# Patient Record
Sex: Female | Born: 1985 | Race: White | Hispanic: No | Marital: Single | Smoking: Never smoker
Health system: Southern US, Community
[De-identification: ages and names within clinical notes are randomized; demographics above are authoritative.]

---

## 2017-02-02 ENCOUNTER — Encounter (HOSPITAL_COMMUNITY): Payer: Self-pay

## 2017-02-02 ENCOUNTER — Emergency Department (HOSPITAL_COMMUNITY)
Admission: EM | Admit: 2017-02-02 | Discharge: 2017-02-02 | Disposition: A | Discharge status: Home / Self Care | Payer: Self-pay | Attending: Emergency Medicine | Admitting: Emergency Medicine

## 2017-02-02 ENCOUNTER — Emergency Department (HOSPITAL_COMMUNITY): Payer: Self-pay

## 2017-02-02 DIAGNOSIS — R51 Headache: Secondary | ICD-10-CM | POA: Insufficient documentation

## 2017-02-02 DIAGNOSIS — Y999 Unspecified external cause status: Secondary | ICD-10-CM | POA: Insufficient documentation

## 2017-02-02 DIAGNOSIS — M791 Myalgia, unspecified site: Secondary | ICD-10-CM

## 2017-02-02 DIAGNOSIS — W57XXXA Bitten or stung by nonvenomous insect and other nonvenomous arthropods, initial encounter: Secondary | ICD-10-CM

## 2017-02-02 DIAGNOSIS — Z79899 Other long term (current) drug therapy: Secondary | ICD-10-CM | POA: Insufficient documentation

## 2017-02-02 DIAGNOSIS — Y929 Unspecified place or not applicable: Secondary | ICD-10-CM | POA: Insufficient documentation

## 2017-02-02 DIAGNOSIS — R5383 Other fatigue: Secondary | ICD-10-CM

## 2017-02-02 DIAGNOSIS — S30861A Insect bite (nonvenomous) of abdominal wall, initial encounter: Secondary | ICD-10-CM | POA: Insufficient documentation

## 2017-02-02 DIAGNOSIS — Y939 Activity, unspecified: Secondary | ICD-10-CM | POA: Insufficient documentation

## 2017-02-02 DIAGNOSIS — R519 Headache, unspecified: Secondary | ICD-10-CM

## 2017-02-02 LAB — CBC WITH DIFFERENTIAL/PLATELET
BASOS ABS: 0.1 10*3/uL (ref 0.0–0.1)
Basophils Relative: 0 %
Eosinophils Absolute: 0.4 10*3/uL (ref 0.0–0.7)
Eosinophils Relative: 3 %
HEMATOCRIT: 40.1 % (ref 36.0–46.0)
Hemoglobin: 13.8 g/dL (ref 12.0–15.0)
LYMPHS ABS: 4.4 10*3/uL — AB (ref 0.7–4.0)
LYMPHS PCT: 32 %
MCH: 32.8 pg (ref 26.0–34.0)
MCHC: 34.4 g/dL (ref 30.0–36.0)
MCV: 95.2 fL (ref 78.0–100.0)
Monocytes Absolute: 1.1 10*3/uL — ABNORMAL HIGH (ref 0.1–1.0)
Monocytes Relative: 8 %
NEUTROS ABS: 7.7 10*3/uL (ref 1.7–7.7)
Neutrophils Relative %: 57 %
Platelets: 377 10*3/uL (ref 150–400)
RBC: 4.21 MIL/uL (ref 3.87–5.11)
RDW: 12.8 % (ref 11.5–15.5)
WBC: 13.6 10*3/uL — ABNORMAL HIGH (ref 4.0–10.5)

## 2017-02-02 LAB — COMPREHENSIVE METABOLIC PANEL
ALK PHOS: 82 U/L (ref 38–126)
ALT: 13 U/L — AB (ref 14–54)
AST: 17 U/L (ref 15–41)
Albumin: 4.6 g/dL (ref 3.5–5.0)
Anion gap: 7 (ref 5–15)
BILIRUBIN TOTAL: 0.5 mg/dL (ref 0.3–1.2)
BUN: 14 mg/dL (ref 6–20)
CALCIUM: 9.4 mg/dL (ref 8.9–10.3)
CO2: 29 mmol/L (ref 22–32)
CREATININE: 0.69 mg/dL (ref 0.44–1.00)
Chloride: 106 mmol/L (ref 101–111)
Glucose, Bld: 101 mg/dL — ABNORMAL HIGH (ref 65–99)
Potassium: 3.6 mmol/L (ref 3.5–5.1)
Sodium: 142 mmol/L (ref 135–145)
TOTAL PROTEIN: 7.8 g/dL (ref 6.5–8.1)

## 2017-02-02 LAB — I-STAT BETA HCG BLOOD, ED (MC, WL, AP ONLY)

## 2017-02-02 LAB — I-STAT CG4 LACTIC ACID, ED: Lactic Acid, Venous: 1.15 mmol/L (ref 0.5–1.9)

## 2017-02-02 MED ORDER — DOXYCYCLINE HYCLATE 100 MG PO CAPS: 100.0000 mg | ORAL_CAPSULE | Freq: Two times a day (BID) | ORAL | 0 refills | Status: AC

## 2017-02-02 MED ORDER — ACETAMINOPHEN 500 MG PO TABS
1000.0000 mg | ORAL_TABLET | Freq: Once | ORAL | Status: AC
  Administered 2017-02-02: 1000 mg via ORAL
  Filled 2017-02-02: qty 2

## 2017-02-02 NOTE — ED Triage Notes (Signed)
She c/o generalized h/a since last Wed. She states she removed a tick from right thoracic area last Thurs. She is in no distress.

## 2017-02-02 NOTE — Discharge Instructions (Signed)
Please take Ibuprofen (Advil, motrin) and Tylenol (acetaminophen) to relieve your pain.  You may take up to 800 MG (4 pills) of normal strength ibuprofen every 8 hours as needed.  In between doses of ibuprofen you make take tylenol, up to 1,000 mg (two extra strength pills).  Do not take more than 3,000 mg tylenol in a 24 hour period.  Please check all medication labels as many medications such as pain and cold medications may contain tylenol.  Do not drink alcohol while taking these medications.  Do not take other NSAID'S while taking ibuprofen (such as aleve or naproxen).  Please take ibuprofen with food to decrease stomach upset. ° ° °

## 2017-02-02 NOTE — ED Provider Notes (Signed)
[facility]-EMERGENCY DEPT Provider Note   CSN: [id number] Arrival date & time: 02/02/17  1152  By signing my name below, I, Linna Darner, attest that this documentation has been prepared under the direction and in the presence of Lyndel Safe, [place]. Electronically Signed: Linna Darner, Scribe. 02/02/2017. 12:47 PM.  History   Chief Complaint Chief Complaint  Patient presents with  . Headache   The history is provided by the patient. No language interpreter was used.   HPI Comments: Christina Huynh is a 31 y.o. female who presents to the Emergency Department complaining of a constant, waxing and waning, frontal headache beginning one week ago. She reports some associated intermittent chills and "hot flashes", nausea without vomiting, mild dyspnea, and generalized body aches. Patient reports that she noticed a tick on the right side of her abdomen six days ago and subsequently removed it. Patient tried a Advertising account executive prior to arrival today without improvement of her symptoms. She also uses Vicodin for chronic back and right knee pain and took one dose earlier today. She doubts the possibility of pregnancy as she recently had a normal scheduled menses. Patient denies known fevers, rhinorrhea, congestion, postnasal drip, cough, sneezing, ear pain, rashes, neck pain, dysuria, or any other associated symptoms.  No past medical history on file.  There are no active problems to display for this patient.   No past surgical history on file.  OB History    No data available       Home Medications    Prior to Admission medications   Medication Sig Start Date End Date Taking? Authorizing Provider  doxycycline (VIBRAMYCIN) 100 MG capsule Take 1 capsule (100 mg total) by mouth 2 (two) times daily. 02/02/17   Cristina Gong, PA-C    Family History No family history on file.  Social History Social History  Substance Use Topics  . Smoking status: Never Smoker  . Smokeless tobacco:  Never Used  . Alcohol use No     Allergies   Patient has no known allergies.   Review of Systems Review of Systems  Constitutional: Positive for chills and fatigue. Negative for diaphoresis and fever.  HENT: Negative for congestion, ear pain, rhinorrhea and sneezing.   Eyes: Positive for photophobia. Negative for visual disturbance.  Respiratory: Positive for shortness of breath (Mild). Negative for cough.   Cardiovascular: Negative for chest pain and palpitations.  Gastrointestinal: Positive for nausea. Negative for abdominal pain, constipation, diarrhea and vomiting.  Genitourinary: Negative for decreased urine volume, dysuria and flank pain.  Musculoskeletal: Positive for arthralgias (right knee, baseline), back pain (baseline) and myalgias. Negative for neck pain and neck stiffness.  Skin: Positive for wound (From tick bite, on right anterior chest). Negative for color change and rash.  Neurological: Positive for headaches. Negative for speech difficulty, weakness, light-headedness and numbness.   Physical Exam Updated Vital Signs BP 136/81 (BP Location: Right Arm)   Pulse 85   Temp 98.6 F (37 C) (Oral)   Resp 18   LMP 01/17/2017 (Approximate)   SpO2 96%   Physical Exam  Constitutional: She is oriented to person, place, and time. She appears well-developed and well-nourished. No distress.  HENT:  Head: Normocephalic and atraumatic.  Right Ear: External ear normal.  Left Ear: External ear normal.  Eyes: Conjunctivae are normal. No scleral icterus.  Neck: Normal range of motion. Neck supple. No tracheal deviation present.  Cardiovascular: Normal rate, regular rhythm, normal heart sounds and intact distal pulses.  Exam reveals no friction  rub.   No murmur heard. Pulmonary/Chest: Effort normal and breath sounds normal. No respiratory distress. She has no wheezes.  Abdominal: Soft. She exhibits no distension. There is no tenderness.  Musculoskeletal: Normal range of  motion. She exhibits no edema or deformity.  Lymphadenopathy:    She has no cervical adenopathy.  Neurological: She is alert and oriented to person, place, and time. No sensory deficit. She exhibits normal muscle tone.  Mental Status:  Alert, oriented, thought content appropriate, able to give a coherent history. Speech fluent without evidence of aphasia. Able to follow 2 step commands without difficulty.  Cranial Nerves:  II:  Peripheral visual fields grossly normal, pupils equal, round, reactive to light III,IV, VI: ptosis not present, extra-ocular motions intact bilaterally  V,VII: smile symmetric, facial light touch sensation equal VIII: hearing grossly normal to voice  X: uvula elevates symmetrically  XI: bilateral shoulder shrug symmetric and strong XII: midline tongue extension without fassiculations Motor:  Normal tone. 5/5 in upper and lower extremities bilaterally including strong and equal grip strength and dorsiflexion/plantar flexion Gait: normal gait and balance CV: distal pulses palpable throughout   Skin: Skin is warm and dry.  Small red papule to right anterior chest under her breast from reported tick.  No fluctuance, abscess.  Small sub cm area of induration.  No rash around papule.   No rashes to arms, legs, or torso  Psychiatric: She has a normal mood and affect. Her behavior is normal.  Nursing note and vitals reviewed.  ED Treatments / Results  Labs (all labs ordered are listed, but only abnormal results are displayed) Labs Reviewed  CBC WITH DIFFERENTIAL/PLATELET - Abnormal; Notable for the following:       Result Value   WBC 13.6 (*)    Lymphs Abs 4.4 (*)    Monocytes Absolute 1.1 (*)    All other components within normal limits  COMPREHENSIVE METABOLIC PANEL - Abnormal; Notable for the following:    Glucose, Bld 101 (*)    ALT 13 (*)    All other components within normal limits  ROCKY MTN SPOTTED FVR ABS PNL(IGG+IGM)  B. BURGDORFI ANTIBODIES    EHRLICHIA ANTIBODY PANEL  I-STAT BETA HCG BLOOD, ED ([facility], [facility], [facility] ONLY)  I-STAT CG4 LACTIC ACID, ED    EKG  EKG Interpretation None       Radiology Dg Chest 2 View  Result Date: 02/02/2017 CLINICAL DATA:  Removed a tick from the right static of the thorax last Thursday. The patient reports generalized body aches and dyspnea. Headache for the past week. EXAM: CHEST  2 VIEW COMPARISON:  None in PACs FINDINGS: The lungs are adequately inflated and clear. The heart and pulmonary vascularity are normal. The mediastinum is normal in width. There is no pleural effusion. The trachea is midline. The bony thorax exhibits no acute abnormality. IMPRESSION: There is no active cardiopulmonary disease. Electronically Signed   By: David  [place] M.D.   On: 02/02/2017 14:14    Procedures Procedures (including critical care time)  DIAGNOSTIC STUDIES: Oxygen Saturation is 96% on RA, adequate by my interpretation.    COORDINATION OF CARE: 12:46 PM Discussed treatment plan with pt at bedside and pt agreed to plan.  Medications Ordered in ED Medications  acetaminophen (TYLENOL) tablet 1,000 mg (1,000 mg Oral Given 02/02/17 1321)     Initial Impression / Assessment and Plan / ED Course  I have reviewed the triage vital signs and the nursing notes.  Pertinent labs & imaging results  that were available during my care of the patient were reviewed by me and considered in my medical decision making (see chart for details).  Clinical Course as of Feb 02 1937  Wed Feb 02, 2017  1456 Re-checked patient.  She reports her headache isn't much better.  She was given the option to stay and receive additional medications or get her rx and go home.  She chose to go home.   [EH]    Clinical Course User Index [EH] Cristina GongHammond, Jayleena Stille W, PA-C   Christina MossesAlisha Henckel presents with one week of headache, fatigue, mild dyspneaand muscle aches after removing a tick.  She has not had any rashes.  Based on symptoms and tick bite  history ordered basic labs and tick panel.  Patient is with out obvious rashes, normal sodium level.  CXR obtained based on dyspnea, no obvious abnormalities.  Labs reviewed.  Patient continued to have headache, slightly better but still present.  Patient given the option to stay, declined.   Patient instructed on proper hydration, given strict return precautions and rx for doxycycline.  Patient was given the option to ask questions, all of which were answered to the best of my ability.  Patient instructed to follow up with her PCP regarding her symptoms. Patient instructed the doxycycline may make her sensitive to the sun.  Patient is aware that she has blood work for tick borne illness is pending.   At this time there does not appear to be any evidence of an acute emergency medical condition and the patient appears stable for discharge with appropriate outpatient follow up.Diagnosis was discussed with patient who verbalizes understanding and is agreeable to discharge. Pt case discussed with Dr. Adela LankFloyd who agrees with my plan.     Final Clinical Impressions(s) / ED Diagnoses   Final diagnoses:  Acute nonintractable headache, unspecified headache type  Tick bite, initial encounter  Fatigue, unspecified type  Myalgia    New Prescriptions Discharge Medication List as of 02/02/2017  3:02 PM    START taking these medications   Details  doxycycline (VIBRAMYCIN) 100 MG capsule Take 1 capsule (100 mg total) by mouth 2 (two) times daily., Starting Wed 02/02/2017, Print       I personally performed the services described in this documentation, which was scribed in my presence. The recorded information has been reviewed and is accurate.    Cristina GongHammond, Ladasha Schnackenberg W, PA-C 02/02/17 1952    Melene PlanFloyd, Dan, DO 02/03/17 [phone]

## 2017-02-03 LAB — ROCKY MTN SPOTTED FVR ABS PNL(IGG+IGM)
RMSF IGM: 0.78 {index} (ref 0.00–0.89)
RMSF IgG: NEGATIVE

## 2017-02-03 LAB — B. BURGDORFI ANTIBODIES: B burgdorferi Ab IgG+IgM: <0.91 {ISR} (ref 0.00–0.90)

## 2017-02-08 LAB — EHRLICHIA ANTIBODY PANEL
E CHAFFEENSIS AB, IGM: NEGATIVE
E chaffeensis (HGE) Ab, IgG: NEGATIVE
E. Chaffeensis (HME) IgM Titer: NEGATIVE
E.Chaffeensis (HME) IgG: NEGATIVE

## 2018-07-16 IMAGING — CR DG CHEST 2V
2 series · 2 of 2 positions shown · non-contrast
Comparison: None in PACs

CLINICAL DATA: Removed a tick from the right static of the thorax
[REDACTED]. The patient reports generalized body aches and
dyspnea. Headache for the past week.

EXAM:
CHEST  2 VIEW

[w chest pa]
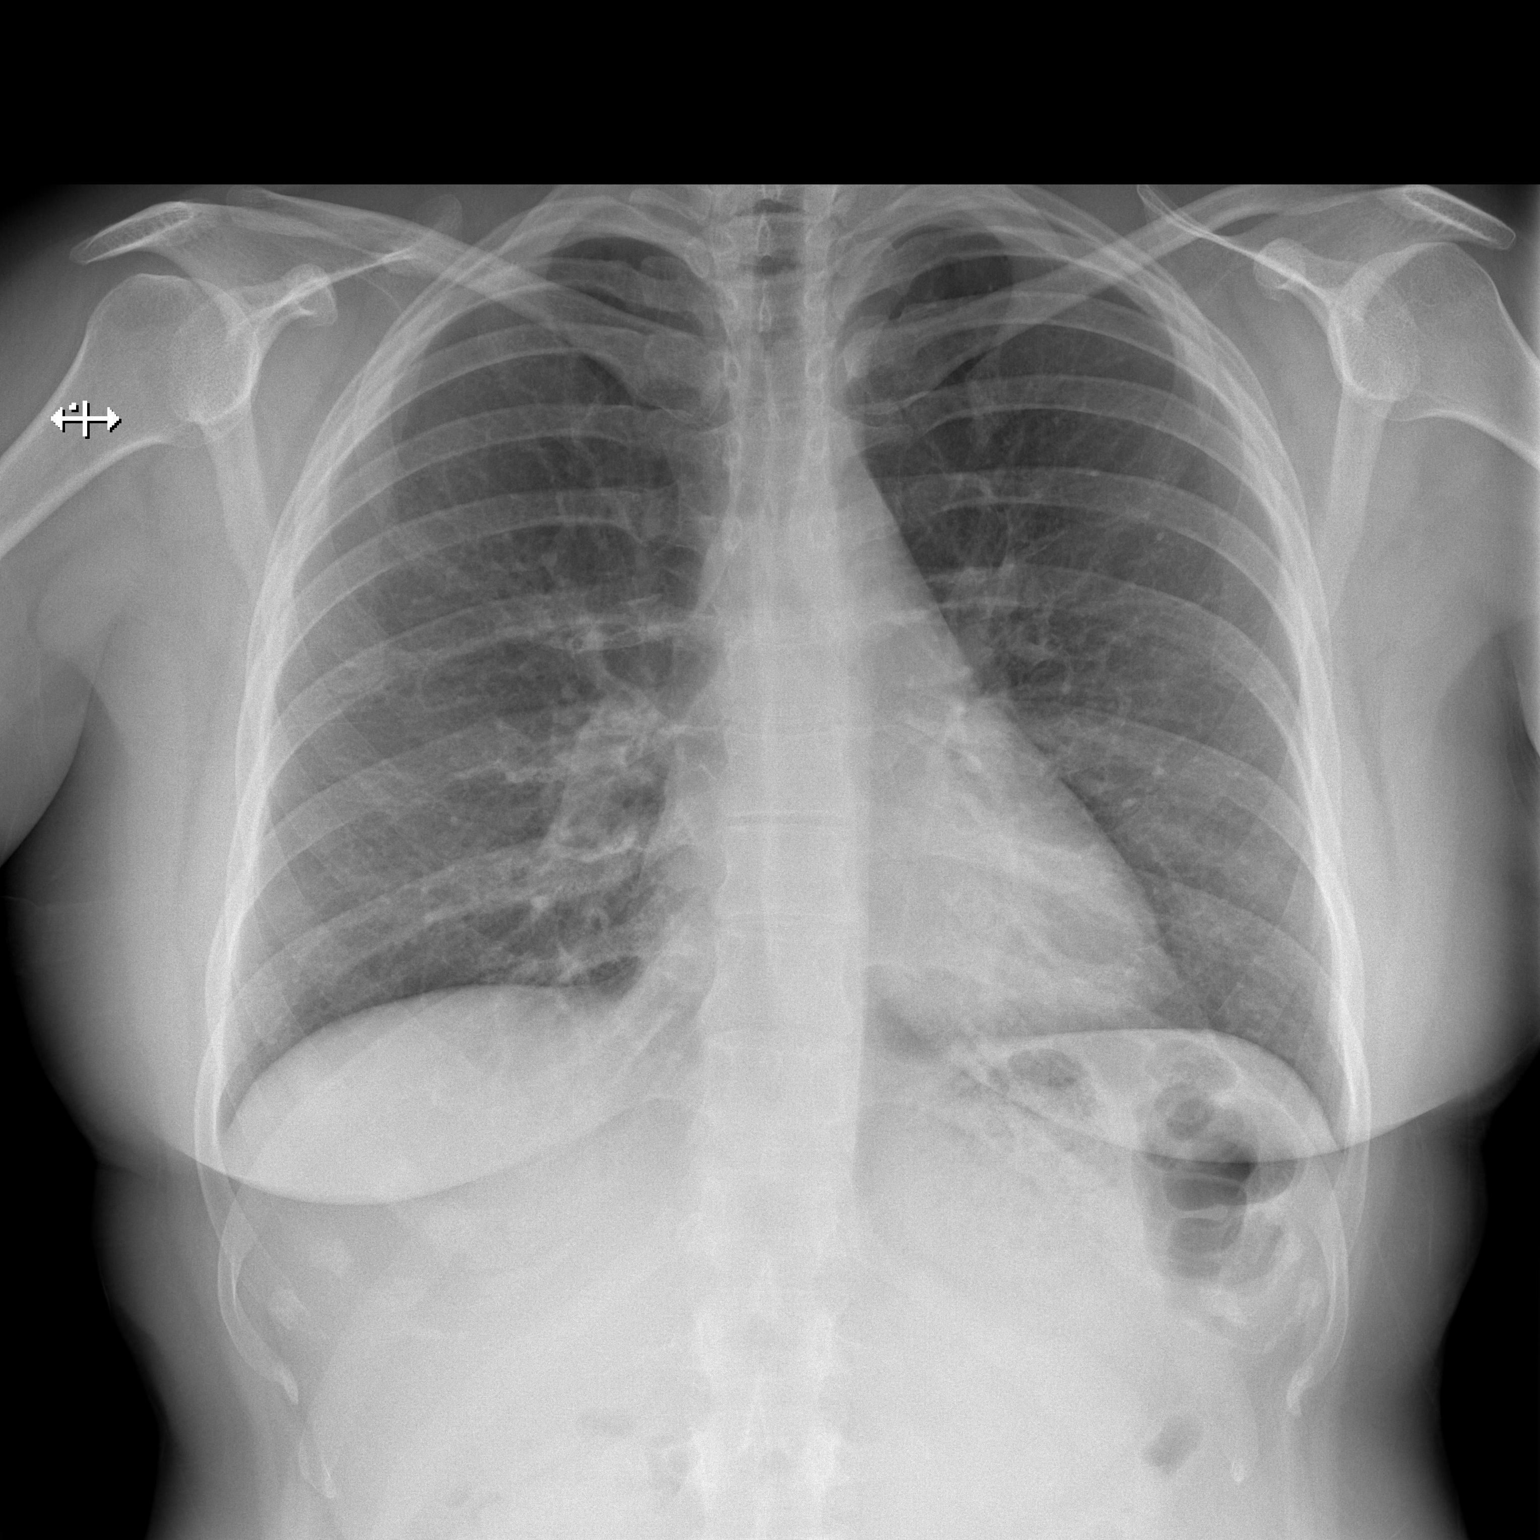

[w chest lat]
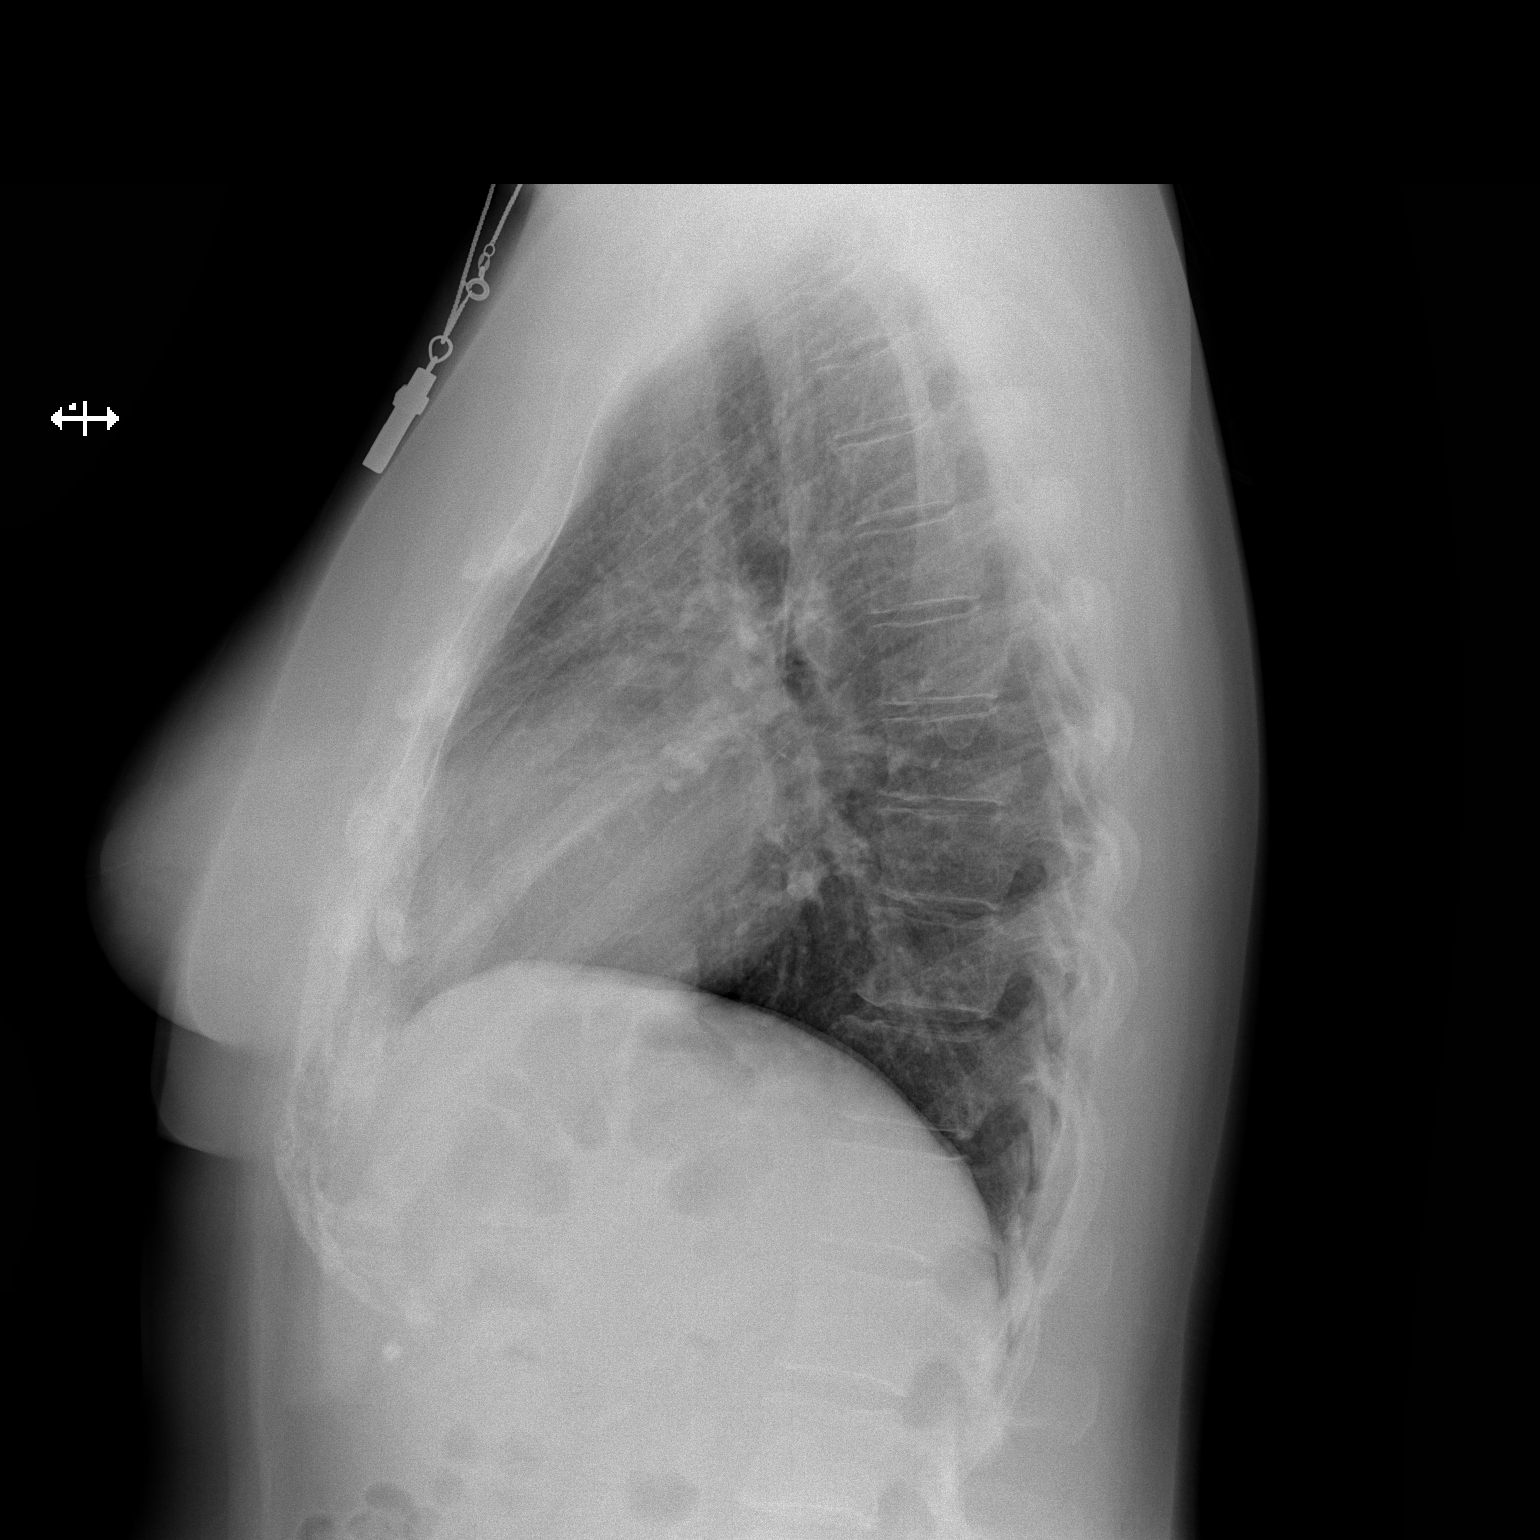

[2 of 2 positions shown; findings below may reference images not displayed]

FINDINGS: The lungs are adequately inflated and clear. The heart and pulmonary
vascularity are normal. The mediastinum is normal in width. There is
no pleural effusion. The trachea is midline. The bony thorax
exhibits no acute abnormality.
IMPRESSION: There is no active cardiopulmonary disease.

## 2018-09-13 ENCOUNTER — Emergency Department (HOSPITAL_COMMUNITY)
Admission: EM | Admit: 2018-09-13 | Discharge: 2018-09-13 | Payer: Medicaid Other | Attending: Emergency Medicine | Admitting: Emergency Medicine

## 2018-09-13 ENCOUNTER — Encounter (HOSPITAL_COMMUNITY): Payer: Self-pay | Admitting: Emergency Medicine

## 2018-09-13 DIAGNOSIS — Z3A30 30 weeks gestation of pregnancy: Secondary | ICD-10-CM | POA: Diagnosis not present

## 2018-09-13 DIAGNOSIS — O9989 Other specified diseases and conditions complicating pregnancy, childbirth and the puerperium: Secondary | ICD-10-CM | POA: Diagnosis present

## 2018-09-13 DIAGNOSIS — Z79899 Other long term (current) drug therapy: Secondary | ICD-10-CM | POA: Insufficient documentation

## 2018-09-13 DIAGNOSIS — Z760 Encounter for issue of repeat prescription: Secondary | ICD-10-CM | POA: Diagnosis not present

## 2018-09-13 MED ORDER — BUPRENORPHINE HCL-NALOXONE HCL 8-2 MG SL SUBL
1.0000 | SUBLINGUAL_TABLET | Freq: Once | SUBLINGUAL | Status: AC
  Administered 2018-09-13: 1 via SUBLINGUAL
  Filled 2018-09-13: qty 1

## 2018-09-13 NOTE — ED Notes (Signed)
Pt reports that she has no complaints or concerns. Denies bleeding or discharge or pain. RN called [facility] nurse, Denny Peon.

## 2018-09-13 NOTE — Discharge Instructions (Addendum)
As discussed, it is very important you continue your ongoing management of your conditions with your physicians. Return here for any concerning changes in your condition.

## 2018-09-13 NOTE — ED Provider Notes (Signed)
[facility] EMERGENCY [facility]PARTMENT Provider Note   CSN: [id number] Arrival date & time: 09/13/18  0601     History   Chief Complaint Chief Complaint  Patient presents with  . Medication Refill    HPI Christina Huynh is a 33 y.o. female.  HPI [facility] Patient is approximately [redacted] weeks pregnant, has a history of opiate addiction, was incarcerated within the past 2 hours. Patient denies abdominal pain, vaginal bleeding, cramping, clear liquid at discharge. She states that she has [facility], which was initially notable for oligo hydramnios, but has since essentially normalized, according to her. She also notes that she [place]. Last dose was yesterday. She denies current physical complaints, including lightheadedness, vomiting, nausea, stating that she feels generally well. Per jail protocol, for a patient on Suboxone, they do not provide this medication, and she was sent here for evaluation.   Medical history: Substance abuse    Surgical history: None   Home Medications    Prior to Admission medications   Medication Sig Start Date End Date Taking? Authorizing Provider  doxycycline (VIBRAMYCIN) 100 MG capsule Take 1 capsule (100 mg total) by mouth 2 (two) times daily. 02/02/17   Cristina GongHammond, Elizabeth W, PA-C    Family History History reviewed. No pertinent family history.  Social History Social History   Tobacco Use  . Smoking status: Never Smoker  . Smokeless tobacco: Never Used  Substance Use Topics  . Alcohol use: No  . Drug use:  Opiate pain medication     Allergies   Venomil honey bee venom [honey bee venom]   Review of Systems Review of Systems  Constitutional:       Per HPI, otherwise negative  HENT:       Per HPI, otherwise negative  Respiratory:       Per HPI, otherwise negative  Cardiovascular:       Per HPI,  otherwise negative  Gastrointestinal: Negative for vomiting.  Endocrine:       Negative aside from HPI  Genitourinary:       Neg aside from HPI   Musculoskeletal:       Lower extremity edema  Skin: Negative.   Neurological: Negative for syncope.     Physical Exam Updated Vital Signs BP 130/78   Pulse (!) 107   Temp 98.1 F (36.7 C) (Oral)   Resp 18   SpO2 100%   Physical Exam Vitals signs and nursing note reviewed.  Constitutional:      General: She is not in acute distress.    Appearance: She is well-developed.  HENT:     Head: Normocephalic and atraumatic.  Eyes:     Conjunctiva/sclera: Conjunctivae normal.  Cardiovascular:     Rate and Rhythm: Normal rate and regular rhythm.  Pulmonary:     Effort: Pulmonary effort is normal. No respiratory distress.     Breath sounds: Normal breath sounds. No stridor.  Abdominal:     General: There is no distension.     Tenderness: There is no abdominal tenderness. There is no guarding.     Comments: Gravid abdomen, no guarding, no rebound, no tenderness, no complaints with palpation  Musculoskeletal:     Right lower leg: Edema present.     Left lower leg: Edema present.  Skin:    General: Skin is warm and dry.  Neurological:     Mental Status: She is  alert and oriented to person, place, and time.     Cranial Nerves: No cranial nerve deficit.      ED Treatments / Results   Procedures Procedures (including critical care time)  Medications Ordered in ED Medications  buprenorphine-naloxone (SUBOXONE) 8-2 mg per SL tablet 1 tablet (has no administration in time range)     Initial Impression / Assessment and Plan / ED Course  I have reviewed the triage vital signs and the nursing notes.  Pertinent labs & imaging results that were available during my care of the patient were reviewed by me and considered in my medical decision making (see chart for details).  After the initial evaluation I reviewed the patient's  paperwork from jail, including documentation of ongoing Suboxone prescription. We discussed the patient's projected length of stay in jail, and patient states that she is likely to release today, after posting bail. With concern for avoiding withdrawal, patient will receive 1 dose of Suboxone here. Absent other complaints, absent hemodynamic instability, with normalization of blood pressure, as the patient has been monitored, no indication for additional labs, imaging, patient notes that she has appropriate ongoing follow-up, both with her addiction counseling and OB care, has no complaints, no evidence for withdrawal, she was discharged in stable condition.  Final Clinical Impressions(s) / ED Diagnoses  Medication management   Gerhard MunchLockwood, Ikaika Showers, MD 09/13/18 [phone]

## 2018-09-13 NOTE — ED Triage Notes (Signed)
Patient is a prisoner of [facility] who is currently on Suboxone which is NOT administered by the jail. Currently 30 weeks and 3 days pregnant and is taking the med due this. Jail needs a current referral/prescription for pt to continue Suboxone at a clinic.

## 2021-12-06 ENCOUNTER — Ambulatory Visit (HOSPITAL_COMMUNITY)
Admission: EM | Admit: 2021-12-06 | Discharge: 2021-12-06 | Disposition: A | Discharge status: Home / Self Care | Payer: Medicaid Other | Attending: Psychiatry | Admitting: Psychiatry

## 2021-12-06 DIAGNOSIS — F1120 Opioid dependence, uncomplicated: Secondary | ICD-10-CM | POA: Insufficient documentation

## 2021-12-06 DIAGNOSIS — F419 Anxiety disorder, unspecified: Secondary | ICD-10-CM | POA: Insufficient documentation

## 2021-12-06 DIAGNOSIS — F32A Depression, unspecified: Secondary | ICD-10-CM | POA: Insufficient documentation

## 2021-12-06 NOTE — BH Assessment (Addendum)
Christina Huynh, Routine; 37 year old presents this date unaccompanied.  Pt denies SI,HI or AVH.  Pt reports she wants to detox from Heroin/fentanyl; also, reports she used to hours ago.  Pt admits to piror MH diagnosis; also, reports that she have taken [facility] (Suboxone) in February 2023.  Pt reports she relapse due to lack of transportation to the facility. ?

## 2021-12-06 NOTE — ED Notes (Signed)
Patient was discharged by the provider. Patient was given an AVS with community resources.  

## 2021-12-06 NOTE — ED Provider Notes (Addendum)
Behavioral Health Urgent Care Medical Screening Exam ? ?Patient Name: Christina Huynh ?MRN: [id number] ?Date of Evaluation: 12/06/21 ?Chief Complaint:   ?Diagnosis:  ?Final diagnoses:  ?Uncomplicated opioid dependence (HCC)  ? ? ?History of Present illness: Christina Huynh is a 36 y.o. female patient presented to [facility] as a walk in alone requesting detox from fentanyl and heroin.  ? ?Christina Huynh, 36 y.o., female patient seen face to face by this provider, consulted with Dr. Rebecca Eaton; and chart reviewed on 12/06/21.  ? ?On evaluation Christina Huynh reports she has a past psychiatric history of depression, fentanyl abuse and heroin abuse.  She has no outpatient psychiatric services in place.  She denies any previous inpatient psychiatric hospital admissions.  She denies any suicide attempts.  She denies ever participating in residential substance abuse treatment.  She currently does not take any medications.  She presents today requesting detox for fentanyl and heroin use. ? ?During evaluation Christina Huynh is in sitting position.  She is alert/oriented x4 and cooperative.  She is fairly groomed and makes good eye contact.  Her speech is normal.  She is able to answer questions appropriately.  She endorses increased anxiety and depression. She has an anxious affect. She endorses feelings of helplessness, hopelessness, decreased sleep and appetite.  She denies SI/HI/AVH.  She contracts for safety.  Objectively she does not appear to be responding to internal/external stimuli.  She denies paranoid and delusional thought content. ? ?Discussed admission to the [facility].  Explained the milieu and expectations.  Explained that Suboxone and methadone are not prescribed in this facility for opioid detox.  Patient states she has been prescribed Suboxone in the past and that she will need it for detox.  She declined admission to the [facility] and requested to be discharged.  ? ?At this time Christina Huynh is educated and verbalizes understanding of  mental health resources and other crisis services in the community. She is instructed to call 911 and present to the nearest emergency room should she experience any suicidal/homicidal ideation, auditory/visual/hallucinations, or detrimental worsening of her mental health condition.  She was a also advised by Clinical research associate that she could call the toll-free phone on insurance card to assist with identifying in network counselors and agencies or number on back of Medicaid card to speak with care coordinator ?  ?Psychiatric Specialty Exam ? ?Presentation  ?General Appearance:Appropriate for Environment; Casual ? ?Eye Contact:Good ? ?Speech:Clear and Coherent; Normal Rate ? ?Speech Volume:Normal ? ?Handedness:Right ? ? ?Mood and Affect  ?Mood:Anxious; Depressed ? ?Affect:Congruent ? ? ?Thought Process  ?Thought Processes:Coherent ? ?Descriptions of Associations:Intact ? ?Orientation:Full (Time, Place and Person) ? ?Thought Content:Logical ?   Hallucinations:None ? ?Ideas of Reference:None ? ?Suicidal Thoughts:No ? ?Homicidal Thoughts:No ? ? ?Sensorium  ?Memory:Immediate Good; Remote Good; Recent Good ? ?Judgment:Fair ? ?Insight:Fair ? ? ?Executive Functions  ?Concentration:Good ? ?Attention Span:Good ? ?Recall:Good ? ?Fund of Knowledge:Good ? ?Language:Good ? ? ?Psychomotor Activity  ?Psychomotor Activity:Normal ? ? ?Assets  ?Assets:Communication Skills; Desire for Improvement; Financial Resources/Insurance; Physical Health; Resilience; Social Support ? ? ?Sleep  ?Sleep:Fair ? ?Number of hours: No data recorded ? ?No data recorded ? ?Physical Exam: ?Physical Exam ?Vitals and nursing note reviewed.  ?Constitutional:   ?   General: She is not in acute distress. ?   Appearance: Normal appearance. She is not ill-appearing.  ?HENT:  ?   Head: Normocephalic.  ?Eyes:  ?   General:     ?   Right eye: No discharge.     ?  Left eye: No discharge.  ?   Conjunctiva/sclera: Conjunctivae normal.  ?Cardiovascular:  ?   Rate and Rhythm:  Normal rate.  ?Pulmonary:  ?   Effort: Pulmonary effort is normal.  ?Musculoskeletal:     ?   General: Normal range of motion.  ?   Cervical back: Normal range of motion.  ?Skin: ?   Coloration: Skin is not jaundiced or pale.  ?Neurological:  ?   Mental Status: She is alert and oriented to person, place, and time.  ?Psychiatric:     ?   Attention and Perception: Attention and perception normal.     ?   Mood and Affect: Affect normal. Mood is anxious and depressed.     ?   Speech: Speech normal.     ?   Behavior: Behavior normal. Behavior is cooperative.     ?   Thought Content: Thought content normal.     ?   Cognition and Memory: Cognition normal.     ?   Judgment: Judgment is impulsive.  ? ?Review of Systems  ?Constitutional: Negative.   ?HENT: Negative.    ?Eyes: Negative.   ?Respiratory: Negative.    ?Cardiovascular: Negative.   ?Musculoskeletal: Negative.   ?Skin: Negative.   ?Neurological: Negative.   ?Psychiatric/Behavioral:  Positive for depression and substance abuse. The patient is nervous/anxious.   ?Blood pressure 118/69, pulse 98, temperature 98.2 ?F (36.8 ?C), temperature source Oral, resp. rate 18, SpO2 99 %. There is no height or weight on file to calculate BMI. ? ?Musculoskeletal: ?Strength & Muscle Tone: within normal limits ?Gait & Station: normal ?Patient leans: N/A ? ? ?[facility] M[facility] Discharge Disposition for Follow up and Recommendations: ?Based on my evaluation the patient does not appear to have an emergency medical condition and can be discharged with resources and follow up care in outpatient services for Medication Management, Substance Abuse Intensive Outpatient Program, and Individual Therapy ? ?Discharge patient ? ?Provided outpatient psychiatric resources for medication management, therapy, and substance abuse treatment. ? ?No evidence of imminent risk to self or others at present.    ?Patient does not meet criteria for psychiatric inpatient admission. ?Discussed crisis plan, support from  social network, calling 911, coming to the Emergency Department, and calling Suicide Hotline.  ? ?Ardis Hughs, NP ?12/06/2021, 4:44 PM ? ?

## 2021-12-06 NOTE — Discharge Instructions (Addendum)
Substance Abuse Treatment Programs ° °Intensive Outpatient Programs °[facility]     °[address]      °[place], [place]                   °[phone]      ° °[facility] °[address] °[place], [place] °336-379-7146 ° °[facility]     °(Inpatient and outpatient)     °[address]           °336-832-9800   ° °[facility] °336-288-1484 (Suboxone and Methadone) ° °[address]      °[place], [place] [address]      °336-882-2125      ° °[address] °[place], [place] °852-3033 ° °[facility] (Outpatient/Inpatient, Chemical)    °(insurance only) [phone]      °       °[facility] (Groups & Residential) °[place], [place] °336-389-1413 ° °   °[facility]     °[address]     °[place], [place]      °336-389-1413      ° °[facility] (for caregivers and family) °[address] °[place], [place] °336-299-4655 ° ° ° ° ° °Residential Treatment Programs °[facility]      °[address], [place], [place] [address]  °([phone] [phone]      ° °[facility] °[address] [place], [place] [address] °[phone] ° °[facility]        °336-248-8914      ° °[facility] °1-800-659-3381 ° °[facility]             °[address]                                         °[place], [place]                                                °877-615-2722 or 336-784-9470                              ° °[facility] [place] °[address] °[place], [address] °1.877.941.8954 ° °[facility]    °[address]      °[place], [place]     °336-273-5306      ° °[facility] °[address] °[place], [place] °336-285-9073 ° °[facility]   °[address]     °[place], [place] [address]     °336-899-1550      °Admissions: 8am-3pm M-F ° °[facility] °[address] °[place],  [place] °336-227-7417 ° °[facility]: Residential Program (90 Days)   °[place], [place]      °336-725-8389 or 800-758-6077    ° °[facility] °[place],  °(Walk in Hours over the weekend or by referral) ° °[facility] °[address], [place],  [address] °(336) [phone] ° °Crisis Mobile: [facility]:  1-877-626-1772 (for crisis response 24 hours a day) °[facility] Hotline:      1-800-256-2452 °Outpatient Psychiatry and Counseling ° °[facility]: Mobile Crisis   Management 24 hours:  1-877-626-1772 ° °[facility] sliding scale fee and walk in schedule: M-F 8am-12pm/1pm-3pm °[address]  °[place], [place] [address] °336-387-6161 ° °[facility] °[address] °[place], [place] [address] °336-703-9650 ° °[facility] ([facility] known [facility])- new patient walk-in appointments available Monday - Friday 8am -3pm.          °[address] °[place], [place] [address] °336-676-6840 or crisis line- 336-676-6905 ° °[facility] Intensive Outpatient Therapy Program °[address] °[place], [place] [address] °336-832-9804 ° °[facility]                  °Crisis Services      °336.641.4993      °[address]     °[place], [place] [address]                ° °[facility]   °[facility] °[phone] °[address] °[place], [place] [address] ° ° °[facility]          °[address]  °[place], [place] [address]       °(336) [phone] ° °[facility] °[address] °[place], [place] [address] °336-292-1510 ° °[facility]    °[address]    °[place], [place] [address]     °336-632-3505      ° °Parish McKinney, MD     °[address]     °[place] [address]     °336-282-1251     °  °[facility] °[address] °[place] [address] ° °[facility]     °[address]     °[place], [place]      °336-542-2076      ° °[facility] °Shamsher Ahluwalia, MD °[address] °[place], [place] [address] °336-420-9558 ° °[facility]     °[address]     °[place], [place] [address]     °336-274-1237      ° °[facility] °[address] °[place], [place] [address] °336-854-4450 °Resources for Temporary Residential Assistance/Crisis Centers ° °DAY CENTERS °[facility] °M-F 8am-3pm   °[address] [place], [place] [address]   336-332-0824 °Services include: laundry, barbering, support groups, case management, phone  & computer access, showers, AA/NA mtgs, mental health/substance abuse nurse, job skills class, disability information, VA assistance, spiritual classes, etc.  ° °HOMELESS SHELTERS ° °[facility]     °[facility]   °[address], [place]     °336.271.5959       °       °[facility] (women and children)       °[address] °, [place] [address] °336-275-0820 °[email or web address] for application and process °Application Required ° °[facility]   °[address]    °[place] [address]     °336.886.4922       °             °[facility] °[address] °, [place] [address] °336.273.5572 °336-235-0363(schedule application appt.) °Application Required ° °[facility] (women only)    °[address]     °[place], [place] [address]     °336-884-1039      °  Intake starts 6pm daily °Need valid ID, SSC, & Police report °[facility] [place] °[address] °[place], [place] °336-881-5420 °Application Required ° °[facility] (men only)     °[address]      °[place], [place]     °336.748.1962      ° °[facility] °(Pregnant women only) °[address] °[place], [place] °336-275-0206 ° °[facility]      °[address]      °[place], [place] [address]     °336-722-9951      °       °[facility] °[address] °[place], [place] °336-723-1848 °90 day commitment/SA/Application process ° °[facility](men only)     °[address]     °[place], [place]     °336-748-1962       °Check-in at 7pm     °       °[facility] °[address] °[place], [place] [address] °336-248-6684 °Men/Women/Women and Children must be there by 7 pm ° °[facility] °[place], [place] °336-722-8721                ° °

## 2023-09-24 DIAGNOSIS — R600 Localized edema: Secondary | ICD-10-CM | POA: Diagnosis not present

## 2023-09-25 ENCOUNTER — Other Ambulatory Visit: Payer: Self-pay

## 2023-09-25 ENCOUNTER — Emergency Department (HOSPITAL_COMMUNITY)
Admission: EM | Admit: 2023-09-25 | Discharge: 2023-09-26 | Disposition: A | Discharge status: Home / Self Care | Payer: Medicaid Other | Attending: Emergency Medicine | Admitting: Emergency Medicine

## 2023-09-25 DIAGNOSIS — D72829 Elevated white blood cell count, unspecified: Secondary | ICD-10-CM | POA: Insufficient documentation

## 2023-09-25 DIAGNOSIS — R791 Abnormal coagulation profile: Secondary | ICD-10-CM | POA: Diagnosis not present

## 2023-09-25 DIAGNOSIS — L03116 Cellulitis of left lower limb: Secondary | ICD-10-CM | POA: Insufficient documentation

## 2023-09-25 DIAGNOSIS — L03115 Cellulitis of right lower limb: Secondary | ICD-10-CM | POA: Diagnosis present

## 2023-09-26 ENCOUNTER — Emergency Department (HOSPITAL_COMMUNITY): Payer: Medicaid Other

## 2023-09-26 ENCOUNTER — Other Ambulatory Visit: Payer: Self-pay

## 2023-09-26 ENCOUNTER — Encounter (HOSPITAL_COMMUNITY): Payer: Self-pay | Admitting: *Deleted

## 2023-09-26 LAB — URINALYSIS, W/ REFLEX TO CULTURE (INFECTION SUSPECTED)
Bacteria, UA: NONE SEEN
Bilirubin Urine: NEGATIVE
Glucose, UA: NEGATIVE mg/dL
Hgb urine dipstick: NEGATIVE
Ketones, ur: NEGATIVE mg/dL
Leukocytes,Ua: NEGATIVE
Nitrite: NEGATIVE
Protein, ur: NEGATIVE mg/dL
Specific Gravity, Urine: 1.018 (ref 1.005–1.030)
pH: 5 (ref 5.0–8.0)

## 2023-09-26 LAB — COMPREHENSIVE METABOLIC PANEL
ALT: 21 U/L (ref 0–44)
AST: 20 U/L (ref 15–41)
Albumin: 3.7 g/dL (ref 3.5–5.0)
Alkaline Phosphatase: 64 U/L (ref 38–126)
Anion gap: 14 (ref 5–15)
BUN: 5 mg/dL — ABNORMAL LOW (ref 6–20)
CO2: 22 mmol/L (ref 22–32)
Calcium: 9.5 mg/dL (ref 8.9–10.3)
Chloride: 103 mmol/L (ref 98–111)
Creatinine, Ser: 0.69 mg/dL (ref 0.44–1.00)
GFR, Estimated: >60 mL/min (ref >60)
Glucose, Bld: 108 mg/dL — ABNORMAL HIGH (ref 70–99)
Potassium: 3.6 mmol/L (ref 3.5–5.1)
Sodium: 139 mmol/L (ref 135–145)
Total Bilirubin: 0.7 mg/dL (ref 0.0–1.2)
Total Protein: 8.6 g/dL — ABNORMAL HIGH (ref 6.5–8.1)

## 2023-09-26 LAB — CBC WITH DIFFERENTIAL/PLATELET
Abs Immature Granulocytes: 0.08 10*3/uL — ABNORMAL HIGH (ref 0.00–0.07)
Basophils Absolute: 0.1 10*3/uL (ref 0.0–0.1)
Basophils Relative: 1 %
Eosinophils Absolute: 0.2 10*3/uL (ref 0.0–0.5)
Eosinophils Relative: 1 %
HCT: 39.1 % (ref 36.0–46.0)
Hemoglobin: 12.5 g/dL (ref 12.0–15.0)
Immature Granulocytes: 1 %
Lymphocytes Relative: 26 %
Lymphs Abs: 4.3 10*3/uL — ABNORMAL HIGH (ref 0.7–4.0)
MCH: 28.9 pg (ref 26.0–34.0)
MCHC: 32 g/dL (ref 30.0–36.0)
MCV: 90.5 fL (ref 80.0–100.0)
Monocytes Absolute: 0.9 10*3/uL (ref 0.1–1.0)
Monocytes Relative: 5 %
Neutro Abs: 11.2 10*3/uL — ABNORMAL HIGH (ref 1.7–7.7)
Neutrophils Relative %: 66 %
Platelets: 677 10*3/uL — ABNORMAL HIGH (ref 150–400)
RBC: 4.32 MIL/uL (ref 3.87–5.11)
RDW: 12.7 % (ref 11.5–15.5)
WBC: 16.7 10*3/uL — ABNORMAL HIGH (ref 4.0–10.5)
nRBC: 0 % (ref 0.0–0.2)

## 2023-09-26 LAB — I-STAT CG4 LACTIC ACID, ED
Lactic Acid, Venous: 2.1 mmol/L (ref 0.5–1.9)
Lactic Acid, Venous: 0.9 mmol/L (ref 0.5–1.9)

## 2023-09-26 LAB — PROTIME-INR
INR: 1 (ref 0.8–1.2)
Prothrombin Time: 13.2 s (ref 11.4–15.2)

## 2023-09-26 LAB — PREGNANCY, URINE: Preg Test, Ur: NEGATIVE

## 2023-09-26 MED ORDER — DOXYCYCLINE HYCLATE 100 MG PO TABS
100.0000 mg | ORAL_TABLET | Freq: Once | ORAL | Status: AC
  Administered 2023-09-26: 100 mg via ORAL
  Filled 2023-09-26: qty 1

## 2023-09-26 MED ORDER — LACTATED RINGERS IV BOLUS
1000.0000 mL | Freq: Once | INTRAVENOUS | Status: AC
  Administered 2023-09-26: 1000 mL via INTRAVENOUS

## 2023-09-26 NOTE — ED Provider Triage Note (Signed)
 Emergency Medicine Provider Triage Evaluation Note  Christina Huynh , a 38 y.o. female  was evaluated in triage.  Pt complains of cellulitis.  Just left AMA from [place] to use Fentanyl.  Had been admitted for cellulitis.  States that she had meds called into the pharmacy, but she hasn't picked them up yet.  Review of Systems  Positive: cellulitis Negative: fever  Physical Exam  BP (!) 154/97 (BP Location: Left Arm)   Pulse 71   Temp 97.7 F (36.5 C)   Resp 16   Ht 5\' 7"  (1.702 m)   Wt 78.9 kg   LMP 09/20/2023   SpO2 100%   BMI 27.25 kg/m  Gen:   Awake, no distress   Resp:  Normal effort  MSK:   Moves extremities without difficulty  Other:  Wounds and erythema of lower extremities  Medical Decision Making  Medically screening exam initiated at 1:06 AM.  Appropriate orders placed.  Christina Huynh was informed that the remainder of the evaluation will be completed by another provider, this initial triage assessment does not replace that evaluation, and the importance of remaining in the ED until their evaluation is complete.     Sherel Dikes, PA-C 09/26/23 [phone]

## 2023-09-26 NOTE — Discharge Instructions (Addendum)
 Please fill and start taking your antibiotic.

## 2023-09-26 NOTE — ED Provider Notes (Signed)
 [facility]PT [facility] Emergency Department Provider Note MRN:  [id number]  Arrival date & time: 09/26/23     Chief Complaint   No chief complaint on file.   History of Present Illness   Christina Huynh is a 38 y.o. year-old female presents to the ED with chief complaint of cellulitis.  Was seen in [place] for the same and left AMA to use fentanyl.  She returns because she hasn't had her antibiotics.  She states that her legs look and feel improved compared to prior admission.  She denies new fevers or chills.  History provided by patient.   Review of Systems  Pertinent positive and negative review of systems noted in HPI.    Physical Exam   Vitals:   09/26/23 0441 09/26/23 0445  BP:  136/87  Pulse:  86  Resp:  18  Temp: 98.9 F (37.2 C)   SpO2:  100%    CONSTITUTIONAL:  non toxic-appearing, NAD NEURO:  Alert and oriented x 3, CN 3-12 grossly intact EYES:  eyes equal and reactive ENT/NECK:  Supple, no stridor  CARDIO:  normal rate, regular rhythm, appears well-perfused  PULM:  No respiratory distress, CTAB GI/GU:  non-distended,  MSK/SPINE:  No gross deformities, mild edema, moves all extremities  SKIN:  no rash, atraumatic, erythema of lower extremities with some swelling and old sores   *Additional and/or pertinent findings included in MDM below  Diagnostic and Interventional Summary    EKG Interpretation Date/Time:    Ventricular Rate:    PR Interval:    QRS Duration:    QT Interval:    QTC Calculation:   R Axis:      Text Interpretation:         Labs Reviewed  COMPREHENSIVE METABOLIC PANEL - Abnormal; Notable for the following components:      Result Value   Glucose, Bld 108 (*)    BUN 5 (*)    Total Protein 8.6 (*)    All other components within normal limits  CBC WITH DIFFERENTIAL/PLATELET - Abnormal; Notable for the following components:   WBC 16.7 (*)    Platelets 677 (*)    Neutro Abs 11.2 (*)    Lymphs Abs 4.3 (*)    Abs  Immature Granulocytes 0.08 (*)    All other components within normal limits  URINALYSIS, W/ REFLEX TO CULTURE (INFECTION SUSPECTED) - Abnormal; Notable for the following components:   APPearance HAZY (*)    All other components within normal limits  I-STAT CG4 LACTIC ACID, ED - Abnormal; Notable for the following components:   Lactic Acid, Venous 2.1 (*)    All other components within normal limits  CULTURE, BLOOD (ROUTINE X 2)  CULTURE, BLOOD (ROUTINE X 2)  PROTIME-INR  PREGNANCY, URINE  I-STAT CG4 LACTIC ACID, ED    DG Chest 2 View  Final Result      Medications  lactated ringers bolus 1,000 mL (0 mLs Intravenous Stopped 09/26/23 0430)  doxycycline (VIBRA-TABS) tablet 100 mg (100 mg Oral Given 09/26/23 0436)     Procedures  /  Critical Care Procedures  ED Course and Medical Decision Making  I have reviewed the triage vital signs, the nursing notes, and pertinent available records from the EMR.  Social Determinants Affecting Complexity of Care: Patient .   ED Course: Clinical Course as of 09/26/23 0458  Mon Sep 26, 2023  4098 I-Stat Lactic Acid, ED Initial lactic was mildly elevated at 2.1, repeat is 0.9, she is not  hypotensive, not tachycardic, no fever here.  Doubt sepsis. [RB]  0456 CBC with Differential(!) Moderate leukocytosis, she has not been on antibiotics for the past day or 2.  Will give dose of doxycycline.  She states that she will be able to get her antibiotics filled. [RB]    Clinical Course User Index [RB] Roxy Horseman, PA-C    Medical Decision Making Patient seen recently at [facility].  I was able to obtain and review the records as listed below.  Patient is nontoxic-appearing now.  She is afebrile.  Normotensive.  She does have mildly elevated white blood cell count.  Normal repeat lactic.  Blood cultures are pending.  I told her that if these are positive, we will call her, and she will need to return to the emergency department for IV  antibiotics.  In the meantime, given her well appearance, I feel that it is appropriate for her to be discharged home on doxycycline.  This was already prescribed by [facility].  She states that she will be able to get it filled.  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  Amount and/or Complexity of Data Reviewed External Data Reviewed: labs, radiology and notes.    Details: Outside records from [facility] are received via fax and are reviewed.  She had a negative PE study.  She had right lower extremity CT that showed no evidence of abscess or osteomyelitis.  She had bilateral lower extremity DVT studies that were negative.  She had a normal 2D echocardiogram.  She was discharged home on doxycycline, or rather she left AGAINST MEDICAL ADVICE, but was prescribed doxycycline.  She states that she can still get this filled.  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 Labs: ordered. Decision-making details documented in ED Course. Radiology: ordered.  Risk Prescription drug management.         Consultants: No consultations were needed in caring for this patient.   Treatment and Plan: I considered admission due to patient's initial presentation, but after considering the examination and diagnostic results, patient will not require admission and can be discharged with outpatient follow-up.  Patient seen by and discussed with attending physician, Dr. Madilyn Hook, who agrees with plan.  Final Clinical Impressions(s) / ED Diagnoses     ICD-10-CM   1. Cellulitis of left lower extremity  L03.116     2. Cellulitis of right lower extremity  L03.115       ED Discharge Orders     None         Discharge Instructions Discussed with and Provided to Patient:     Discharge Instructions      Please fill and start taking your antibiotic.        Roxy Horseman, PA-C 09/26/23 0458    Tilden Fossa, MD 09/27/23 2326

## 2023-09-26 NOTE — ED Notes (Signed)
 Just assumed care of patient. Patient c/o rash. Patient has mulp. Rashes on her legs bil with mild redness. Patient was in the hospital in [place] and left AMA because she did not have a Arts administrator. Patient is A&O x 4. Patient is in noted distress at the present time will continue to monitor for any changes. Patient placed on full cardiac monitor with pulse ox.

## 2023-09-26 NOTE — ED Triage Notes (Signed)
 The pt  is c/o abscesses on both legs for a week  the pt reports that she took an iv overdose of fentanyl today also.  She was seen in [place] Friday and had some lab tests run lmp one week ago

## 2023-10-01 LAB — CULTURE, BLOOD (ROUTINE X 2)
Culture: NO GROWTH
Culture: NO GROWTH
